# Patient Record
Sex: Male | Born: 1960 | Race: White | Hispanic: No | Marital: Married | Smoking: Never smoker
Health system: Southern US, Community
[De-identification: ages and names within clinical notes are randomized; demographics above are authoritative.]

## PROBLEM LIST (undated history)

## (undated) DIAGNOSIS — T8859XA Other complications of anesthesia, initial encounter: Secondary | ICD-10-CM

## (undated) DIAGNOSIS — T4145XA Adverse effect of unspecified anesthetic, initial encounter: Secondary | ICD-10-CM

---

## 1987-06-14 HISTORY — PX: LACERATION REPAIR: SHX5168

## 2013-06-13 ENCOUNTER — Emergency Department (HOSPITAL_COMMUNITY)
Admission: EM | Admit: 2013-06-13 | Discharge: 2013-06-13 | Disposition: A | Discharge status: Home / Self Care | Payer: PRIVATE HEALTH INSURANCE | Attending: Emergency Medicine | Admitting: Emergency Medicine

## 2013-06-13 ENCOUNTER — Emergency Department (HOSPITAL_COMMUNITY): Payer: PRIVATE HEALTH INSURANCE

## 2013-06-13 DIAGNOSIS — R5381 Other malaise: Secondary | ICD-10-CM | POA: Insufficient documentation

## 2013-06-13 DIAGNOSIS — R5383 Other fatigue: Secondary | ICD-10-CM

## 2013-06-13 DIAGNOSIS — S61209A Unspecified open wound of unspecified finger without damage to nail, initial encounter: Secondary | ICD-10-CM | POA: Insufficient documentation

## 2013-06-13 DIAGNOSIS — S61219A Laceration without foreign body of unspecified finger without damage to nail, initial encounter: Secondary | ICD-10-CM

## 2013-06-13 DIAGNOSIS — Y9389 Activity, other specified: Secondary | ICD-10-CM | POA: Insufficient documentation

## 2013-06-13 DIAGNOSIS — W268XXA Contact with other sharp object(s), not elsewhere classified, initial encounter: Secondary | ICD-10-CM | POA: Insufficient documentation

## 2013-06-13 DIAGNOSIS — Y9289 Other specified places as the place of occurrence of the external cause: Secondary | ICD-10-CM | POA: Insufficient documentation

## 2013-06-13 DIAGNOSIS — M795 Residual foreign body in soft tissue: Secondary | ICD-10-CM

## 2013-06-13 MED ORDER — HYDROCODONE-ACETAMINOPHEN 5-325 MG PO TABS: 1.0000 | ORAL_TABLET | ORAL | Status: AC | PRN

## 2013-06-13 MED ORDER — CEPHALEXIN 500 MG PO CAPS: 500.0000 mg | ORAL_CAPSULE | Freq: Four times a day (QID) | ORAL | Status: AC

## 2013-06-13 NOTE — ED Notes (Signed)
Pt states he cut his right small finger on a beer glass this morning at midnight.  Bleeding is controlled.  He can move digit, but states he has numbness to right medial small finger since laceration.

## 2013-06-13 NOTE — Discharge Instructions (Signed)
Read the information below.  Use the prescribed medication as directed.  Please discuss all new medications with your pharmacist.  Do not take additional tylenol while taking the prescribed pain medication to avoid overdose.  You may return to the Emergency Department at any time for worsening condition or any new symptoms that concern you.  If you develop uncontrolled pain, redness, swelling, pus draining from the wound, or fevers greater than 100.4, return to the ER immediately for a recheck.       Cast or Splint Care Casts and splints support injured limbs and keep bones from moving while they heal.  HOME CARE  Keep the cast or splint uncovered during the drying period.  A plaster cast can take 24 to 48 hours to dry.  A fiberglass cast will dry in less than 1 hour.  Do not rest the cast on anything harder than a pillow for 24 hours.  Do not put weight on your injured limb. Do not put pressure on the cast. Wait for your doctor's approval.  Keep the cast or splint dry.  Cover the cast or splint with a plastic bag during baths or wet weather.  If you have a cast over your chest and belly (trunk), take sponge baths until the cast is taken off.  If your cast gets wet, dry it with a towel or blow dryer. Use the cool setting on the blow dryer.  Keep your cast or splint clean. Wash a dirty cast with a damp cloth.  Do not put any objects under your cast or splint.  Do not scratch the skin under the cast with an object. If itching is a problem, use a blow dryer on a cool setting over the itchy area.  Do not trim or cut your cast.  Do not take out the padding from inside your cast.  Exercise your joints near the cast as told by your doctor.  Raise (elevate) your injured limb on 1 or 2 pillows for the first 1 to 3 days. GET HELP IF:  Your cast or splint cracks.  Your cast or splint is too tight or too loose.  You itch badly under the cast.  Your cast gets wet or has a soft  spot.  You have a bad smell coming from the cast.  You get an object stuck under the cast.  Your skin around the cast becomes red or sore.  You have new or more pain after the cast is put on. GET HELP RIGHT AWAY IF:  You have fluid leaking through the cast.  You cannot move your fingers or toes.  Your fingers or toes turn blue or white or are cool, painful, or puffy (swollen).  You have tingling or lose feeling (numbness) around the injured area.  You have bad pain or pressure under the cast.  You have trouble breathing or have shortness of breath.  You have chest pain. Document Released: 09/29/2010 Document Revised: 01/30/2013 Document Reviewed: 12/06/2012 [facility] Patient Information 2014 [place], [place].  Laceration, Old, Not Sutured A laceration is a cut or lesion that goes through all layers of the skin and into the tissue just beneath the skin. Usually these are stitched up or held together with tape or glue shortly after an injury. However, if several or more hours have passed before getting care, too many germs (bacteria) get into the wound. Stitching it closed at this point brings the risk of infection. If your caregiver feels your laceration is too old,  it is sometimes left open and dressed regularly to allow healing from the bottom layer up. HOME CARE INSTRUCTIONS   You should change the dressing twice a day or as instructed by your caregiver. If the bandage or wound packing sticks, soak it off with soapy water. When you redress your wound, make sure that the dressing or packing goes all the way to the bottom of the wound. The top of the wound is kept open so it can heal from the bottom up. There is less chance for infection with this method.  Twice a day, wash the area with soap and water to remove all the creams or ointments if used. Rinse off the soap. Pat dry with a clean towel. Look for signs of infection (see below).  Re-apply creams or ointments if they were used  to dress the wound. This also helps keep the bandage from sticking.  If the bandage becomes wet, dirty, or develops a foul smell, change it as soon as possible.  Only take over-the-counter or prescription medicines for pain, discomfort, or fever as directed by your caregiver. You might need a tetanus shot now if:  You have no idea when you had the last one.  You have never had a tetanus shot before.  Your cut had dirt in it.  Your lacertaion was dirty, and your last tetanus shot was more than 7 years ago.  Your laceration was clean, and your last tetanus shot was more than 10 years ago. If you need a tetanus shot, and you decide not to get one, there is a rare chance of getting tetanus. Sickness from tetanus can be serious. If you got a tetanus shot, your arm may swell, get red and warm to the touch at the shot site. This is common and not a problem. SEEK MEDICAL CARE IF:   There is redness, swelling, or increasing pain in the wound.  There is a red line that goes up your arm or leg.  Pus is coming from wound.  You develop an unexplained oral temperature above 102 F (38.9 C).  You notice a foul smell coming from the wound or dressing.  You notice something coming out of the wound such as wood or glass.  The wound is on your hand or foot and you find that you are unable to properly move a finger or toe.  There is severe swelling around the wound causing pain and numbness.  There is a change in color in your arm, hand, leg, or foot. MAKE SURE YOU:   Understand these instructions.  Will watch your condition.  Will get help right away if you are not doing well or get worse. Document Released: 04/27/2006 Document Revised: 08/22/2011 Document Reviewed: 11/17/2008 [facility] Patient Information 2014 [place]

## 2013-06-13 NOTE — ED Notes (Signed)
Pt was catching a cup that fell last night at midnight and cut rt pinky finger, has some nubness to digit , pink in color. Bleeding controlled

## 2013-06-13 NOTE — ED Provider Notes (Signed)
CSN: [id number]     Arrival date & time 06/13/13  1137 History   First MD Initiated Contact with Patient 06/13/13 1150 This chart was scribed for non-physician practitioner Trixie Dredge, PA-C working with Donnetta Hutching, MD by Valera Castle, ED scribe. This patient was seen in room WTR5/WTR5 and the patient's care was started at 12:16 PM.     Chief Complaint  Patient presents with  . Laceration    The history is provided by the patient. No language interpreter was used.   HPI Comments: Daniel Buchanan is a 53 y.o. male who presents to the Emergency Department complaining of a deep right pinky laceration, with some associated numbness and sudden, moderate, constant pain, onset last night when he tried to catch a glass cup that broke and cut his finger.  The injury occurred at midnight.  He is unable to bend this finger and is concerned about a tendon injury.  He reports being right hand dominant. He reports washing out the laceration with hydrogen peroxide, and wrapping with steri strips. Bleeding controlled.  He reports he has not seen a hand specialist in the past. He denies any other associated symptoms.   PCP - KAPLAN,KRISTEN, PA-C  No past medical history on file. No past surgical history on file. No family history on file. History  Substance Use Topics  . Smoking status: Not on file  . Smokeless tobacco: Not on file  . Alcohol Use: Not on file    Review of Systems  Skin: Positive for wound (laceration over right pinky finger). Negative for color change and pallor.  Allergic/Immunologic: Negative for immunocompromised state.  Neurological: Positive for weakness and numbness. Negative for syncope, light-headedness and headaches.  Hematological: Does not bruise/bleed easily.    Allergies  Review of patient's allergies indicates no known allergies.  Home Medications   Current Outpatient Rx  Name  Route  Sig  Dispense  Refill  . aspirin 325 MG tablet   Oral   Take 325 mg by mouth  every 4 (four) hours as needed for headache.           Physical Exam  Nursing note and vitals reviewed. Constitutional: He appears well-developed and well-nourished. No distress.  HENT:  Head: Normocephalic and atraumatic.  Neck: Neck supple.  Pulmonary/Chest: Effort normal.  Musculoskeletal:  Right 5th finger laceration over radial aspect, V shaped. Sensation decreased. Cap refill < 2 seconds. Able to move at MCP, but unable to move at DIP and PIP. Wound is hemostatic.  Neurological: He is alert.  Skin: He is not diaphoretic.    ED Course  Procedures (including critical care time)  DIAGNOSTIC STUDIES: Oxygen Saturation is 98% on room air, normal by my interpretation.    COORDINATION OF CARE: 12:19 PM-Discussed treatment plan which includes cleaning the wound and pain medication with pt at bedside and pt agreed to plan. Will give pt splint and advised pt to f/u hand surgeon.  LACERATION REPAIR PROCEDURE NOTE The patient's identification was confirmed and consent was obtained. This procedure was performed by Trixie Dredge, PA-C at 1:55 PM. Site: Right 5th digit Sterile procedures observed: Saline, copious amount of irrigation No FB seen or palpated Anesthetic used (type and amt): 4cc 2% Lidocaine without Epinephrine Suture type/size: 5-0 vicryl Length:2 cm   # of Sutures: 1 Technique: simple interrupted interrupted Complexity: Simple Tetanus UTD per patient  Labs Review Labs Reviewed - No data to display Imaging Review Dg Finger Little Right  06/13/2013   CLINICAL DATA:  Laceration  EXAM: RIGHT LITTLE FINGER 2+V  COMPARISON:  None.  FINDINGS: There is soft tissue injury to the radial side of the 5th digit. There is a linear density along the radial side of the distal interphalangeal joint. This could represent an old avulsion fracture but cannot exclude a foreign body. No evidence of acute fracture. No dislocation.  IMPRESSION: 1. No fracture dislocation. 2. Soft tissue  injury to the radial side of the 5th digit. 3. Potential foreign body at the distal interphalangeal joint versus remote avulsion fracture. Favor remote injury.   Electronically Signed   By: Genevive BiStewart  Edmunds M.D.   On: 06/13/2013 12:05    EKG Interpretation   None       Discussed patient with Dr Merlyn LotKuzma who agrees with plan to clean out and place one suture in ED, d/c home on keflex, he agrees to follow up with him in the office.    MDM   1. Laceration of finger with tendon involvement, initial encounter   2. Foreign body in soft tissue    Patient with laceration to fifth finger of dominant hand. There is tendon involvement. Patient came in 12-13 hours after the incident. I spoke with Dr. Merlyn LotKuzma, hand surgery, who agreed with plan to clean out the wound in place one suture to hold the flap in place, discharge patient home on Keflex with a posterior splint in and he will see him in his office. There was a foreign body noted on the x-ray however I was not able to find it during my exploration. Patient is aware that there is likely a glass foreign body in his finger. Patient discharged home with Norco and Keflex. Hand surgery followup.  Discussed result, findings, treatment, and follow up  with patient.  Pt given return precautions.  Pt verbalizes understanding and agrees with plan.        I personally performed the services described in this documentation, which was scribed in my presence. The recorded information has been reviewed and is accurate.    MarengoEmily Jorian Willhoite, PA-C 06/13/13 [phone]

## 2013-06-14 NOTE — ED Provider Notes (Signed)
Medical screening examination/treatment/procedure(s) were performed by non-physician practitioner and as supervising physician I was immediately available for consultation/collaboration.  EKG Interpretation   None        Donnetta HutchingBrian Clatie Kessen, MD 06/14/13 1406

## 2013-06-18 ENCOUNTER — Other Ambulatory Visit: Payer: Self-pay | Admitting: Orthopedic Surgery

## 2013-06-20 ENCOUNTER — Encounter (HOSPITAL_BASED_OUTPATIENT_CLINIC_OR_DEPARTMENT_OTHER): Payer: Self-pay | Admitting: *Deleted

## 2013-06-20 NOTE — Progress Notes (Signed)
No labs needed

## 2013-06-25 ENCOUNTER — Ambulatory Visit (HOSPITAL_BASED_OUTPATIENT_CLINIC_OR_DEPARTMENT_OTHER): Payer: PRIVATE HEALTH INSURANCE | Admitting: *Deleted

## 2013-06-25 ENCOUNTER — Encounter (HOSPITAL_BASED_OUTPATIENT_CLINIC_OR_DEPARTMENT_OTHER): Payer: PRIVATE HEALTH INSURANCE | Admitting: *Deleted

## 2013-06-25 ENCOUNTER — Encounter (HOSPITAL_BASED_OUTPATIENT_CLINIC_OR_DEPARTMENT_OTHER): Payer: Self-pay | Admitting: *Deleted

## 2013-06-25 ENCOUNTER — Ambulatory Visit (HOSPITAL_BASED_OUTPATIENT_CLINIC_OR_DEPARTMENT_OTHER)
Admission: RE | Admit: 2013-06-25 | Discharge: 2013-06-25 | Disposition: A | Discharge status: Home / Self Care | Payer: PRIVATE HEALTH INSURANCE | Source: Ambulatory Visit | Attending: Orthopedic Surgery | Admitting: Orthopedic Surgery

## 2013-06-25 ENCOUNTER — Encounter (HOSPITAL_BASED_OUTPATIENT_CLINIC_OR_DEPARTMENT_OTHER)
Admission: RE | Disposition: A | Discharge status: Home / Self Care | Payer: Self-pay | Source: Ambulatory Visit | Attending: Orthopedic Surgery

## 2013-06-25 DIAGNOSIS — W268XXA Contact with other sharp object(s), not elsewhere classified, initial encounter: Secondary | ICD-10-CM | POA: Insufficient documentation

## 2013-06-25 DIAGNOSIS — S61209A Unspecified open wound of unspecified finger without damage to nail, initial encounter: Secondary | ICD-10-CM | POA: Insufficient documentation

## 2013-06-25 DIAGNOSIS — T148XXA Other injury of unspecified body region, initial encounter: Secondary | ICD-10-CM | POA: Insufficient documentation

## 2013-06-25 DIAGNOSIS — S65509A Unspecified injury of blood vessel of unspecified finger, initial encounter: Secondary | ICD-10-CM | POA: Insufficient documentation

## 2013-06-25 HISTORY — DX: Adverse effect of unspecified anesthetic, initial encounter: T41.45XA

## 2013-06-25 HISTORY — DX: Other complications of anesthesia, initial encounter: T88.59XA

## 2013-06-25 HISTORY — PX: NERVE AND TENDON REPAIR: SHX5693

## 2013-06-25 LAB — POCT HEMOGLOBIN-HEMACUE: HEMOGLOBIN: 15.1 g/dL (ref 13.0–17.0)

## 2013-06-25 SURGERY — NERVE AND TENDON REPAIR
Anesthesia: General | Site: Hand | Laterality: Right

## 2013-06-25 MED ORDER — CEFAZOLIN SODIUM-DEXTROSE 2-3 GM-% IV SOLR
INTRAVENOUS | Status: AC
  Filled 2013-06-25: qty 50

## 2013-06-25 MED ORDER — CHLORHEXIDINE GLUCONATE 4 % EX LIQD: 60.0000 mL | Freq: Once | CUTANEOUS | Status: DC

## 2013-06-25 MED ORDER — ONDANSETRON HCL 4 MG/2ML IJ SOLN
INTRAMUSCULAR | Status: DC | PRN
  Administered 2013-06-25: 4 mg via INTRAVENOUS

## 2013-06-25 MED ORDER — HYDROCODONE-ACETAMINOPHEN 5-325 MG PO TABS: ORAL_TABLET | ORAL | Status: AC

## 2013-06-25 MED ORDER — MIDAZOLAM HCL 2 MG/2ML IJ SOLN: 1.0000 mg | INTRAMUSCULAR | Status: DC | PRN

## 2013-06-25 MED ORDER — FENTANYL CITRATE 0.05 MG/ML IJ SOLN
INTRAMUSCULAR | Status: DC | PRN
  Administered 2013-06-25: 50 ug via INTRAVENOUS
  Administered 2013-06-25: 100 ug via INTRAVENOUS

## 2013-06-25 MED ORDER — FENTANYL CITRATE 0.05 MG/ML IJ SOLN
INTRAMUSCULAR | Status: AC
  Filled 2013-06-25: qty 6

## 2013-06-25 MED ORDER — PROPOFOL 10 MG/ML IV BOLUS
INTRAVENOUS | Status: DC | PRN
  Administered 2013-06-25: 250 mg via INTRAVENOUS

## 2013-06-25 MED ORDER — FENTANYL CITRATE 0.05 MG/ML IJ SOLN: 50.0000 ug | INTRAMUSCULAR | Status: DC | PRN

## 2013-06-25 MED ORDER — HYDROMORPHONE HCL PF 1 MG/ML IJ SOLN: 0.2500 mg | INTRAMUSCULAR | Status: DC | PRN

## 2013-06-25 MED ORDER — BUPIVACAINE HCL (PF) 0.25 % IJ SOLN
INTRAMUSCULAR | Status: DC | PRN
  Administered 2013-06-25: 10 mL

## 2013-06-25 MED ORDER — EPHEDRINE SULFATE 50 MG/ML IJ SOLN
INTRAMUSCULAR | Status: DC | PRN
  Administered 2013-06-25: 5 mg via INTRAVENOUS
  Administered 2013-06-25: 10 mg via INTRAVENOUS

## 2013-06-25 MED ORDER — LIDOCAINE HCL (CARDIAC) 20 MG/ML IV SOLN
INTRAVENOUS | Status: DC | PRN
  Administered 2013-06-25: 75 mg via INTRAVENOUS

## 2013-06-25 MED ORDER — OXYCODONE HCL 5 MG PO TABS: 5.0000 mg | ORAL_TABLET | Freq: Once | ORAL | Status: DC | PRN

## 2013-06-25 MED ORDER — MIDAZOLAM HCL 5 MG/5ML IJ SOLN
INTRAMUSCULAR | Status: DC | PRN
  Administered 2013-06-25: 2 mg via INTRAVENOUS

## 2013-06-25 MED ORDER — CEFAZOLIN SODIUM-DEXTROSE 2-3 GM-% IV SOLR
2.0000 g | INTRAVENOUS | Status: AC
  Administered 2013-06-25: 2 g via INTRAVENOUS

## 2013-06-25 MED ORDER — DEXAMETHASONE SODIUM PHOSPHATE 10 MG/ML IJ SOLN
INTRAMUSCULAR | Status: DC | PRN
  Administered 2013-06-25: 8 mg via INTRAVENOUS

## 2013-06-25 MED ORDER — BUPIVACAINE HCL (PF) 0.25 % IJ SOLN
INTRAMUSCULAR | Status: AC
  Filled 2013-06-25: qty 30

## 2013-06-25 MED ORDER — LACTATED RINGERS IV SOLN
INTRAVENOUS | Status: DC
  Administered 2013-06-25 (×2): via INTRAVENOUS

## 2013-06-25 MED ORDER — MIDAZOLAM HCL 2 MG/2ML IJ SOLN
INTRAMUSCULAR | Status: AC
  Filled 2013-06-25: qty 2

## 2013-06-25 MED ORDER — OXYCODONE HCL 5 MG/5ML PO SOLN: 5.0000 mg | Freq: Once | ORAL | Status: DC | PRN

## 2013-06-25 SURGICAL SUPPLY — 59 items
BAG DECANTER FOR FLEXI CONT (MISCELLANEOUS) IMPLANT
BANDAGE ELASTIC 3 VELCRO ST LF (GAUZE/BANDAGES/DRESSINGS) ×2 IMPLANT
BLADE MINI RND TIP GREEN BEAV (BLADE) IMPLANT
BLADE SURG 15 STRL LF DISP TIS (BLADE) ×2 IMPLANT
BLADE SURG 15 STRL SS (BLADE) ×2
BNDG ESMARK 4X9 LF (GAUZE/BANDAGES/DRESSINGS) ×2 IMPLANT
BNDG GAUZE ELAST 4 BULKY (GAUZE/BANDAGES/DRESSINGS) ×2 IMPLANT
CHLORAPREP W/TINT 26ML (MISCELLANEOUS) ×2 IMPLANT
CORDS BIPOLAR (ELECTRODE) ×2 IMPLANT
COVER MAYO STAND STRL (DRAPES) ×2 IMPLANT
COVER TABLE BACK 60X90 (DRAPES) ×2 IMPLANT
CUFF TOURNIQUET SINGLE 18IN (TOURNIQUET CUFF) ×2 IMPLANT
DECANTER SPIKE VIAL GLASS SM (MISCELLANEOUS) IMPLANT
DRAPE EXTREMITY T 121X128X90 (DRAPE) ×2 IMPLANT
DRAPE SURG 17X23 STRL (DRAPES) ×2 IMPLANT
GAUZE XEROFORM 1X8 LF (GAUZE/BANDAGES/DRESSINGS) ×2 IMPLANT
GLOVE BIO SURGEON STRL SZ 6.5 (GLOVE) ×2 IMPLANT
GLOVE BIO SURGEON STRL SZ7.5 (GLOVE) ×2 IMPLANT
GLOVE BIOGEL PI IND STRL 7.0 (GLOVE) ×1 IMPLANT
GLOVE BIOGEL PI IND STRL 8 (GLOVE) ×2 IMPLANT
GLOVE BIOGEL PI IND STRL 8.5 (GLOVE) ×1 IMPLANT
GLOVE BIOGEL PI INDICATOR 7.0 (GLOVE) ×1
GLOVE BIOGEL PI INDICATOR 8 (GLOVE) ×2
GLOVE BIOGEL PI INDICATOR 8.5 (GLOVE) ×1
GLOVE SURG ORTHO 8.0 STRL STRW (GLOVE) ×2 IMPLANT
GOWN STRL REUS W/ TWL LRG LVL3 (GOWN DISPOSABLE) ×1 IMPLANT
GOWN STRL REUS W/TWL LRG LVL3 (GOWN DISPOSABLE) ×1
GOWN STRL REUS W/TWL XL LVL3 (GOWN DISPOSABLE) IMPLANT
LOOP VESSEL MAXI BLUE (MISCELLANEOUS) IMPLANT
NDL SAFETY ECLIPSE 18X1.5 (NEEDLE) IMPLANT
NEEDLE HYPO 18GX1.5 SHARP (NEEDLE)
NEEDLE HYPO 25X1 1.5 SAFETY (NEEDLE) ×2 IMPLANT
NS IRRIG 1000ML POUR BTL (IV SOLUTION) ×2 IMPLANT
PACK BASIN DAY SURGERY FS (CUSTOM PROCEDURE TRAY) ×2 IMPLANT
PAD CAST 3X4 CTTN HI CHSV (CAST SUPPLIES) ×1 IMPLANT
PAD CAST 4YDX4 CTTN HI CHSV (CAST SUPPLIES) IMPLANT
PADDING CAST ABS 4INX4YD NS (CAST SUPPLIES) ×1
PADDING CAST ABS COTTON 4X4 ST (CAST SUPPLIES) ×1 IMPLANT
PADDING CAST COTTON 3X4 STRL (CAST SUPPLIES) ×1
PADDING CAST COTTON 4X4 STRL (CAST SUPPLIES)
SLEEVE SCD COMPRESS KNEE MED (MISCELLANEOUS) ×2 IMPLANT
SPEAR EYE SURG WECK-CEL (MISCELLANEOUS) ×2 IMPLANT
SPLINT PLASTER CAST XFAST 3X15 (CAST SUPPLIES) ×10 IMPLANT
SPLINT PLASTER XTRA FASTSET 3X (CAST SUPPLIES) ×10
SPONGE GAUZE 4X4 12PLY (GAUZE/BANDAGES/DRESSINGS) ×2 IMPLANT
STOCKINETTE 4X48 STRL (DRAPES) ×2 IMPLANT
SUT ETHIBOND 3-0 V-5 (SUTURE) IMPLANT
SUT ETHILON 4 0 PS 2 18 (SUTURE) ×2 IMPLANT
SUT FIBERWIRE 4-0 18 TAPR NDL (SUTURE) ×2
SUT MERSILENE 6 0 P 1 (SUTURE) ×2 IMPLANT
SUT NYLON 9 0 VRM6 (SUTURE) IMPLANT
SUT PROLENE 6 0 P 1 18 (SUTURE) IMPLANT
SUT SILK 4 0 PS 2 (SUTURE) ×2 IMPLANT
SUT VICRYL 4-0 PS2 18IN ABS (SUTURE) IMPLANT
SUTURE FIBERWR 4-0 18 TAPR NDL (SUTURE) ×1 IMPLANT
SYR BULB 3OZ (MISCELLANEOUS) ×2 IMPLANT
SYR CONTROL 10ML LL (SYRINGE) ×2 IMPLANT
TOWEL OR 17X24 6PK STRL BLUE (TOWEL DISPOSABLE) ×4 IMPLANT
UNDERPAD 30X30 INCONTINENT (UNDERPADS AND DIAPERS) ×2 IMPLANT

## 2013-06-25 NOTE — Anesthesia Postprocedure Evaluation (Signed)
  Anesthesia Post-op Note  Patient: Daniel PolioJaime Buchanan  Procedure(s) Performed: Procedure(s): RIGHT SMALL FLEXOR TENDON REPAIR AND DIGITAL NERVE REPAIR (Right)  Patient Location: PACU  Anesthesia Type:General  Level of Consciousness: awake and alert   Airway and Oxygen Therapy: Patient Spontanous Breathing  Post-op Pain: none  Post-op Assessment: Post-op Vital signs reviewed, Patient's Cardiovascular Status Stable and Respiratory Function Stable  Post-op Vital Signs: Reviewed  Filed Vitals:   06/25/13 1144  BP: 128/74  Pulse: 60  Temp: 36.6 C  Resp: 16    Complications: No apparent anesthesia complications

## 2013-06-25 NOTE — Discharge Instructions (Addendum)

## 2013-06-25 NOTE — Anesthesia Preprocedure Evaluation (Addendum)
Anesthesia Evaluation  Patient identified by MRN, date of birth, ID band Patient awake    Reviewed: Allergy & Precautions, H&P , NPO status , Patient's Chart, lab work & pertinent test results  Airway Mallampati: II TM Distance: >3 FB Neck ROM: Full    Dental no notable dental hx. (+) Teeth Intact and Dental Advisory Given   Pulmonary neg pulmonary ROS,  breath sounds clear to auscultation  Pulmonary exam normal       Cardiovascular negative cardio ROS  Rhythm:Regular Rate:Normal     Neuro/Psych negative neurological ROS  negative psych ROS   GI/Hepatic negative GI ROS, Neg liver ROS,   Endo/Other  negative endocrine ROS  Renal/GU negative Renal ROS  negative genitourinary   Musculoskeletal   Abdominal   Peds  Hematology negative hematology ROS (+)   Anesthesia Other Findings   Reproductive/Obstetrics negative OB ROS                          Anesthesia Physical Anesthesia Plan  ASA: I  Anesthesia Plan: General   Post-op Pain Management:    Induction: Intravenous  Airway Management Planned: LMA  Additional Equipment:   Intra-op Plan:   Post-operative Plan: Extubation in OR  Informed Consent: I have reviewed the patients History and Physical, chart, labs and discussed the procedure including the risks, benefits and alternatives for the proposed anesthesia with the patient or authorized representative who has indicated his/her understanding and acceptance.   Dental advisory given  Plan Discussed with: CRNA  Anesthesia Plan Comments:         Anesthesia Quick Evaluation

## 2013-06-25 NOTE — Transfer of Care (Signed)
Immediate Anesthesia Transfer of Care Note  Patient: Daniel Buchanan  Procedure(s) Performed: Procedure(s): RIGHT SMALL FLEXOR TENDON REPAIR AND DIGITAL NERVE REPAIR (Right)  Patient Location: PACU  Anesthesia Type:General  Level of Consciousness: awake, sedated and patient cooperative  Airway & Oxygen Therapy: Patient Spontanous Breathing and Patient connected to face mask oxygen  Post-op Assessment: Report given to PACU RN, Post -op Vital signs reviewed and stable and Patient moving all extremities  Post vital signs: Reviewed and stable  Complications: No apparent anesthesia complications

## 2013-06-25 NOTE — Anesthesia Procedure Notes (Signed)
Procedure Name: LMA Insertion Date/Time: 06/25/2013 8:39 AM Performed by: Tami RibasKUZMA, KEVIN R Pre-anesthesia Checklist: Patient identified, Emergency Drugs available, Suction available and Patient being monitored Patient Re-evaluated:Patient Re-evaluated prior to inductionOxygen Delivery Method: Circle System Utilized Preoxygenation: Pre-oxygenation with 100% oxygen Intubation Type: IV induction Ventilation: Mask ventilation without difficulty LMA: LMA inserted LMA Size: 5.0 Number of attempts: 1 Airway Equipment and Method: bite block Placement Confirmation: positive ETCO2 and breath sounds checked- equal and bilateral Tube secured with: Tape Dental Injury: Teeth and Oropharynx as per pre-operative assessment

## 2013-06-25 NOTE — H&P (Signed)
  Daniel Buchanan is an 53 y.o. male.   Chief Complaint: right small finger laceration HPI: 53 yo rhd male states he lacerated right small finger while trying to catch a falling glass 06/13/13.  Seen in ED and wound I&D'd and single suture placed.  Followed up in office.  Unable to flex finger.  Reports no previous injury to right small.  Past Medical History  Diagnosis Date  . Complication of anesthesia     very sore throat    Past Surgical History  Procedure Laterality Date  . Laceration repair  1989    left thumb    History reviewed. No pertinent family history. Social History:  reports that he has never smoked. He does not have any smokeless tobacco history on file. He reports that he drinks alcohol. He reports that he does not use illicit drugs.  Allergies: No Known Allergies  Medications Prior to Admission  Medication Sig Dispense Refill  . aspirin 325 MG tablet Take 325 mg by mouth every 4 (four) hours as needed for headache.      . cephALEXin (KEFLEX) 500 MG capsule Take 1 capsule (500 mg total) by mouth 4 (four) times daily.  28 capsule  0  . HYDROcodone-acetaminophen (NORCO/VICODIN) 5-325 MG per tablet Take 1 tablet by mouth every 4 (four) hours as needed.  15 tablet  0    Results for orders placed during the hospital encounter of 06/25/13 (from the past 48 hour(s))  POCT HEMOGLOBIN-HEMACUE     Status: None   Collection Time    06/25/13  7:36 AM      Result Value Range   Hemoglobin 15.1  13.0 - 17.0 g/dL    No results found.   A comprehensive review of systems was negative.  Blood pressure 147/93, pulse 76, temperature 98.1 F (36.7 C), temperature source Oral, resp. rate 18, height 5\' 9"  (1.753 m), weight 188 lb (85.276 kg), SpO2 97.00%.  General appearance: alert, cooperative and appears stated age Head: Normocephalic, without obvious abnormality, atraumatic Neck: supple, symmetrical, trachea midline Resp: clear to auscultation bilaterally Cardio: regular rate  and rhythm GI: non tender Extremities: intact sensation and capillary refill all digits except right small where he has decreased sensation.  unable to flex right small finger.  wound at pip level of small finger. Pulses: 2+ and symmetric Skin: Skin color, texture, turgor normal. No rashes or lesions Neurologic: Grossly normal Incision/Wound: As above  Assessment/Plan Right small finger laceration with tendon and nerve injury.  Non operative and operative treatment options were discussed with the patient and patient wishes to proceed with operative treatment. Risks, benefits, and alternatives of surgery were discussed and the patient agrees with the plan of care.   Marleta Lapierre R 06/25/2013, 8:23 AM

## 2013-06-25 NOTE — Op Note (Signed)
[id number] 

## 2013-06-25 NOTE — Transfer of Care (Deleted)
Immediate Anesthesia Transfer of Care Note  Patient: Daniel Buchanan  Procedure(s) Performed: Procedure(s): RIGHT SMALL FLEXOR TENDON REPAIR AND DIGITAL NERVE REPAIR (Right)  Patient Location: PACU  Anesthesia Type:General  Level of Consciousness: awake, sedated and patient cooperative  Airway & Oxygen Therapy: Patient Spontanous Breathing and Patient connected to face mask oxygen  Post-op Assessment: Report given to PACU RN, Post -op Vital signs reviewed and stable and Patient moving all extremities  Post vital signs: Reviewed and stable  Complications: No apparent anesthesia complications 

## 2013-06-25 NOTE — Brief Op Note (Signed)
06/25/2013  10:09 AM  PATIENT:  Daniel Buchanan  53 y.o. male  PRE-OPERATIVE DIAGNOSIS:  RIGHT SMALL FLEXOR TENDON/ DIGITAL NERVE LACERATION   POST-OPERATIVE DIAGNOSIS:  RIGHT SMALL FLEXOR TENDON/ DIGITAL NERVE LACERATION   PROCEDURE:  Procedure(s): RIGHT SMALL FLEXOR TENDON REPAIR AND DIGITAL NERVE REPAIR (Right) and digital artery repair  SURGEON:  Surgeon(s) and Role:    * Tami RibasKevin R Zoejane Gaulin, MD - Primary    * Nicki ReaperGary R Cinzia Devos, MD - Assisting  PHYSICIAN ASSISTANT:   ASSISTANTS: Cindee SaltGary Ascher Schroepfer, MD   ANESTHESIA:   general  EBL:  Total I/O In: 1600 [I.V.:1600] Out: -   BLOOD ADMINISTERED:none  DRAINS: none   LOCAL MEDICATIONS USED:  MARCAINE     SPECIMEN:  No Specimen  DISPOSITION OF SPECIMEN:  N/A  COUNTS:  YES  TOURNIQUET:   Total Tourniquet Time Documented: Upper Arm (Right) - 67 minutes Total: Upper Arm (Right) - 67 minutes   DICTATION: .Other Dictation: Dictation Number [id number]  PLAN OF CARE: Discharge to home after PACU  PATIENT DISPOSITION:  PACU - hemodynamically stable.

## 2013-06-26 ENCOUNTER — Encounter (HOSPITAL_BASED_OUTPATIENT_CLINIC_OR_DEPARTMENT_OTHER): Payer: Self-pay | Admitting: Orthopedic Surgery

## 2013-06-26 NOTE — Op Note (Deleted)
Daniel, Buchanan NO.:  [id number]  MEDICAL RECORD NO.:  [id number]  LOCATION:                                 FACILITY:  PHYSICIAN:  Daniel Loa, MD        DATE OF BIRTH:  [birth year]  DATE OF PROCEDURE:  06/25/2013 DATE OF DISCHARGE:  06/25/2013                              OPERATIVE REPORT   PREOPERATIVE DIAGNOSIS:  Right small finger flexor tendon lacerations and digital nerve lacerations.  POSTOPERATIVE DIAGNOSIS:  Right small finger FDP and FDS lacerations and radial digital nerve and artery lacerations.  PROCEDURES:  Right small finger repair of FDP and FDS in zone 2.  Repair of radial digital nerve and artery under the microscope.  SURGEON:  Daniel Loa, MD.  ASSISTANT:  Daniel Salt, MD.  ANESTHESIA:  General.  IV FLUIDS:  Per Anesthesia flow sheet.  ESTIMATED BLOOD LOSS:  Minimal.  COMPLICATIONS:  None.  SPECIMENS:  None.  TOURNIQUET TIME:  67 minutes.  DISPOSITION:  Stable to PACU.  INDICATIONS:  Daniel Buchanan is a 53 year old male with lacerations in the right small finger, June 13, 2013.  Presented to the emergency department 12 hours later where the wound was irrigated and a single suture placed.  He was unable to flex the finger and has decreased sensation on the radial side.  We discussed operative repair.  Risks, benefits, alternatives of surgery were discussed including risk of blood loss, infection, damage to nerves, vessels, tendons, ligaments, bone, failure of surgery, need for additional surgery, complications with wound healing, continued pain, and stiffness.  He voiced understanding of these risks and elected to proceed.  OPERATIVE COURSE:  After being identified preoperatively by myself, the patient and I agreed upon procedure and site of procedure.  Surgical site was marked.  Risks, benefits, and alternatives of surgery were reviewed and he wished to proceed.  Surgical consent was signed.  He was given IV Ancef as  preoperative antibiotic prophylaxis.  He was transported to the operating room, placed on the operating room table in supine position with left upper extremity on arm board.  General anesthesia was induced by anesthesiologist.  Right upper extremity was prepped and draped in normal sterile orthopedic fashion.  A surgical pause was performed between surgeons, Anesthesia, and operating room staff and all were in agreement as to the patient, procedure, and site of procedure.  Tourniquet at the proximal aspect of the extremities inflated to 250 mmHg after exsanguination of the limb with Esmarch bandage.  The sutures in the laceration were then removed.  The laceration was extended proximally and distally.  This was carried into subcutaneous tissues by spreading technique.  Lacerations to the flexor tendons were visualized.  It was just under the A4 pulley.  Laceration to the FDS was on the deep surface after the __________.  The radial digital nerve and artery were identified and had been lacerated as well. The ulnar digital nerve and artery were intact.  The tendons were dressed first.  As the FDS was very flat, 4-0 FiberWire suture was used in a figure-of-eight fashion to reapproximate the ends.  The FDP tendon was repaired with a 2-strand modified Haynes Bast  core suture augmented with a 6-0 Prolene epitendinous suture.  The core suture was the 4-0 FiberWire.  This provided good apposition of the tendon ends.  The A2 pulley had been lacerated by the injury.  The microscope was brought in. The artery ends were freshened sharply with the scissors.  The 9-0 nylon suture was used to repair the artery in an interrupted circumferential fashion.  This provided good apposition of the arterial ends.  The digital nerve was repaired again with a nylon suture in an interrupted circumferential fashion.  The wound was copiously irrigated with sterile saline.  It was closed with 4-0 nylon in a horizontal  mattress fashion. It was injected with 4 mL of 0.25% plain Marcaine to aid in postoperative analgesia.  It was then dressed with sterile Xeroform, 4x4s, and wrapped with a Kerlix bandage.  A dorsal blocking splint at the long, ring, and small fingers was placed and wrapped with Kerlix and Ace bandage.  The tourniquet was deflated at 67 minutes.  Fingertips were pink with brisk capillary refill after deflation of tourniquet. The operative drapes were broken down.  The patient was woken from anesthesia safely.  He was transferred back to stretcher and taken to PACU in stable condition.  I will see him back in the office in 1 week for postoperative followup.  We will give him Norco 5/325, 1-2 p.o. q.6 hours p.r.n. pain, dispensed #40.     Daniel LoaKevin Yamir Carignan, MD     KK/MEDQ  D:  06/25/2013  T:  06/25/2013  Job:  [id number]

## 2013-06-26 NOTE — Op Note (Signed)
NAMMercer Pod:  Ballantine, Generoso              ACCOUNT NO.:  [id number]  MEDICAL RECORD NO.:  [id number]  LOCATION:                                 FACILITY:  PHYSICIAN:  Betha LoaKevin Kyriana Yankee, MD        DATE OF BIRTH:  1960-11-23  DATE OF PROCEDURE:  06/25/2013 DATE OF DISCHARGE:  06/25/2013                              OPERATIVE REPORT   PREOPERATIVE DIAGNOSIS:  Right small finger flexor tendon lacerations and digital nerve lacerations.  POSTOPERATIVE DIAGNOSIS:  Right small finger FDP and FDS lacerations and radial digital nerve and artery lacerations.  PROCEDURES:   1. Right small finger repair of FDP in zone 2 2. Right small finger repair of FDS in zone 2   3. Right small finger repair of radial digital nerve under microscope 4. Right small finger repair radial digital artery under microscope  SURGEON:  Betha LoaKevin Dayshon Roback, MD.  ASSISTANT:  Cindee SaltGary Crissa Sowder, MD.  ANESTHESIA:  General.  IV FLUIDS:  Per Anesthesia flow sheet.  ESTIMATED BLOOD LOSS:  Minimal.  COMPLICATIONS:  None.  SPECIMENS:  None.  TOURNIQUET TIME:  67 minutes.  DISPOSITION:  Stable to PACU.  INDICATIONS:  Mr. Daniel Buchanan is a 53 year old male with lacerations in the right small finger, June 13, 2013.  Presented to the emergency department 12 hours later where the wound was irrigated and a single suture placed.  He was unable to flex the finger and has decreased sensation on the radial side.  We discussed operative repair.  Risks, benefits, alternatives of surgery were discussed including risk of blood loss, infection, damage to nerves, vessels, tendons, ligaments, bone, failure of surgery, need for additional surgery, complications with wound healing, continued pain, and stiffness.  He voiced understanding of these risks and elected to proceed.  OPERATIVE COURSE:  After being identified preoperatively by myself, the patient and I agreed upon procedure and site of procedure.  Surgical site was marked.  Risks, benefits, and  alternatives of surgery were reviewed and he wished to proceed.  Surgical consent was signed.  He was given IV Ancef as preoperative antibiotic prophylaxis.  He was transported to the operating room, placed on the operating room table in supine position with left upper extremity on arm board.  General anesthesia was induced by anesthesiologist.  Right upper extremity was prepped and draped in normal sterile orthopedic fashion.  A surgical pause was performed between surgeons, Anesthesia, and operating room staff and all were in agreement as to the patient, procedure, and site of procedure.  Tourniquet at the proximal aspect of the extremity was inflated to 250 mmHg after exsanguination of the limb with Esmarch bandage.  The sutures in the laceration were then removed.  The laceration was extended proximally and distally.  This was carried into subcutaneous tissues by spreading technique.  Lacerations to the flexor tendons were visualized.  It was just under the A4 pulley.  Laceration to the FDS was on the deep surface after the chiasm.  The radial digital nerve and artery were identified and had been lacerated as well. The ulnar digital nerve and artery were intact.  The tendons were dressed first.  As the FDS was very flat, 4-0  FiberWire suture was used in a figure-of-eight fashion to reapproximate the ends.  The FDP tendon was repaired with a 2-strand modified Kessler core suture augmented with a 6-0 Prolene epitendinous suture.  The core suture was the 4-0 FiberWire.  This provided good apposition of the tendon ends.  The A2 pulley had been lacerated by the injury.  The microscope was brought in. The artery ends were freshened sharply with the scissors.  The 9-0 nylon suture was used to repair the artery in an interrupted circumferential fashion.  This provided good apposition of the arterial ends.  The digital nerve was repaired again with a nylon suture in an  interrupted circumferential fashion.  The wound was copiously irrigated with sterile saline.  It was closed with 4-0 nylon in a horizontal mattress fashion. It was injected with 4 mL of 0.25% plain Marcaine to aid in postoperative analgesia.  It was then dressed with sterile Xeroform, 4x4s, and wrapped with a Kerlix bandage.  A dorsal blocking splint at the long, ring, and small fingers was placed and wrapped with Kerlix and Ace bandage.  The tourniquet was deflated at 67 minutes.  Fingertips were pink with brisk capillary refill after deflation of tourniquet. The operative drapes were broken down.  The patient was woken from anesthesia safely.  He was transferred back to stretcher and taken to PACU in stable condition.  I will see him back in the office in 1 week for postoperative followup.  We will give him Norco 5/325, 1-2 p.o. q.6 hours p.r.n. pain, dispensed #40.     Betha Loa, MD     KK/MEDQ  D:  06/25/2013  T:  06/25/2013  Job:  [id number]

## 2014-10-13 IMAGING — CR DG FINGER LITTLE 2+V*R*
3 series · 3 of 3 positions shown · non-contrast
Comparison: None.

CLINICAL DATA: Laceration

EXAM:
RIGHT LITTLE FINGER 2+V

[x finger pa right]
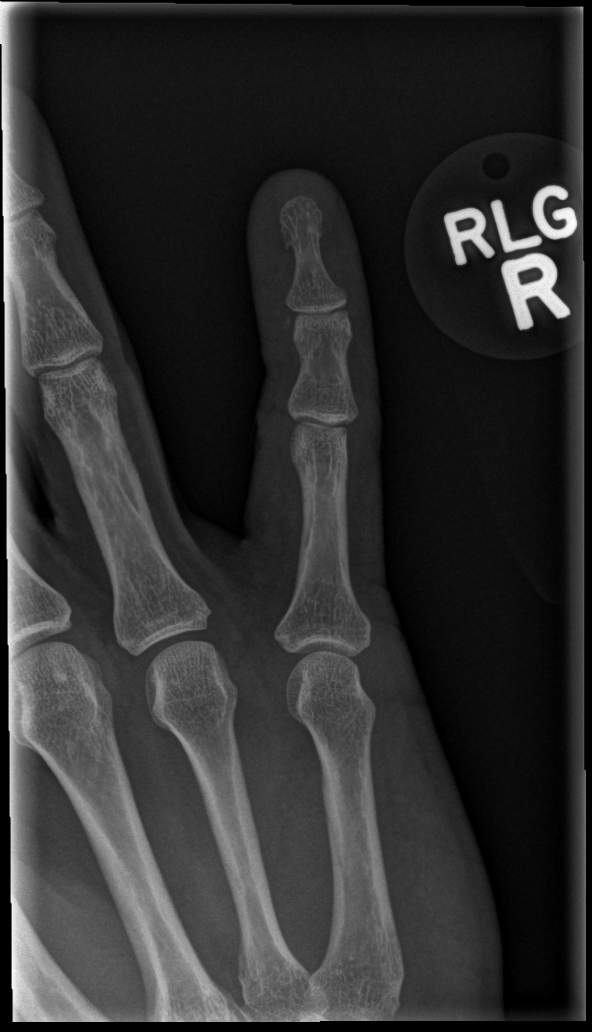

[x finger obl right]
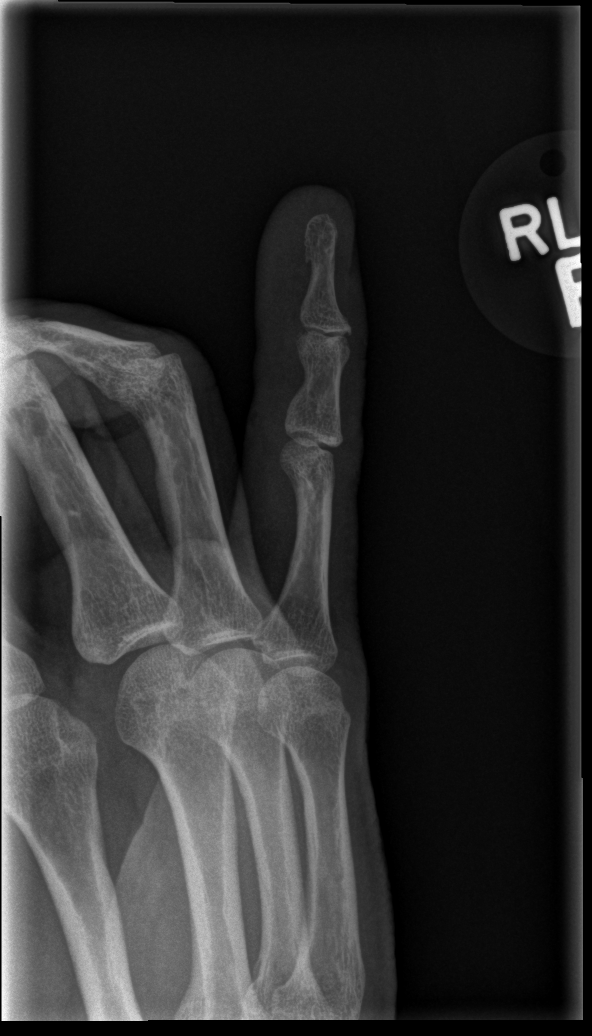

[x finger lat right]
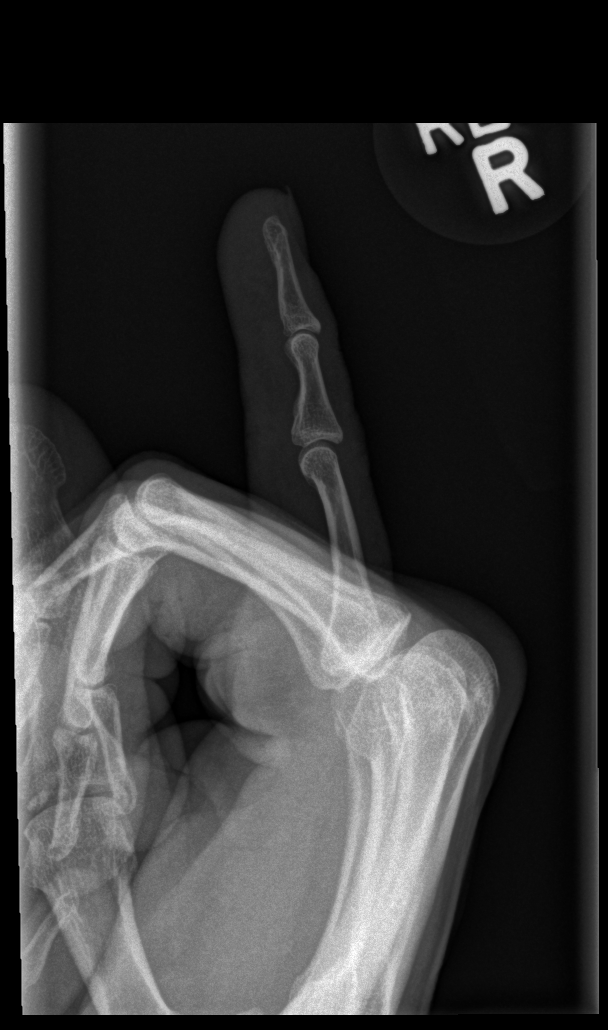

[3 of 3 positions shown; findings below may reference images not displayed]

FINDINGS: There is soft tissue injury to the radial side of the 5th digit.
There is a linear density along the radial side of the distal
interphalangeal joint. This could represent an old avulsion fracture
but cannot exclude a foreign body. No evidence of acute fracture. No
dislocation.
IMPRESSION: 1. No fracture dislocation.
2. Soft tissue injury to the radial side of the 5th digit.
3. Potential foreign body at the distal interphalangeal joint versus
remote avulsion fracture. Favor remote injury.

## 2017-12-19 ENCOUNTER — Other Ambulatory Visit: Payer: Self-pay

## 2017-12-19 ENCOUNTER — Emergency Department (HOSPITAL_COMMUNITY)
Admission: EM | Admit: 2017-12-19 | Discharge: 2017-12-19 | Disposition: A | Discharge status: Home / Self Care | Payer: BLUE CROSS/BLUE SHIELD | Attending: Emergency Medicine | Admitting: Emergency Medicine

## 2017-12-19 ENCOUNTER — Encounter (HOSPITAL_COMMUNITY): Payer: Self-pay | Admitting: Emergency Medicine

## 2017-12-19 ENCOUNTER — Emergency Department (HOSPITAL_COMMUNITY): Payer: BLUE CROSS/BLUE SHIELD

## 2017-12-19 DIAGNOSIS — Z79899 Other long term (current) drug therapy: Secondary | ICD-10-CM | POA: Insufficient documentation

## 2017-12-19 DIAGNOSIS — Z7982 Long term (current) use of aspirin: Secondary | ICD-10-CM | POA: Insufficient documentation

## 2017-12-19 DIAGNOSIS — R079 Chest pain, unspecified: Secondary | ICD-10-CM

## 2017-12-19 LAB — CBC
HEMATOCRIT: 47.8 % (ref 39.0–52.0)
Hemoglobin: 15.5 g/dL (ref 13.0–17.0)
MCH: 31 pg (ref 26.0–34.0)
MCHC: 32.4 g/dL (ref 30.0–36.0)
MCV: 95.6 fL (ref 78.0–100.0)
PLATELETS: 224 10*3/uL (ref 150–400)
RBC: 5 MIL/uL (ref 4.22–5.81)
RDW: 11.9 % (ref 11.5–15.5)
WBC: 4.4 10*3/uL (ref 4.0–10.5)

## 2017-12-19 LAB — BASIC METABOLIC PANEL
Anion gap: 10 (ref 5–15)
BUN: 18 mg/dL (ref 6–20)
CHLORIDE: 105 mmol/L (ref 98–111)
CO2: 25 mmol/L (ref 22–32)
Calcium: 9.3 mg/dL (ref 8.9–10.3)
Creatinine, Ser: 0.92 mg/dL (ref 0.61–1.24)
GFR calc Af Amer: >60 mL/min (ref >60)
GFR calc non Af Amer: >60 mL/min (ref >60)
GLUCOSE: 103 mg/dL — AB (ref 70–99)
POTASSIUM: 3.9 mmol/L (ref 3.5–5.1)
Sodium: 140 mmol/L (ref 135–145)

## 2017-12-19 LAB — I-STAT TROPONIN, ED
Troponin i, poc: 0 ng/mL (ref 0.00–0.08)
Troponin i, poc: 0 ng/mL (ref 0.00–0.08)

## 2017-12-19 MED ORDER — ASPIRIN 81 MG PO CHEW
324.0000 mg | CHEWABLE_TABLET | Freq: Once | ORAL | Status: AC
  Administered 2017-12-19: 324 mg via ORAL
  Filled 2017-12-19: qty 4

## 2017-12-19 NOTE — ED Notes (Addendum)
ED Provider at bedside. 

## 2017-12-19 NOTE — ED Notes (Signed)
Patient transported to X-ray 

## 2017-12-19 NOTE — ED Provider Notes (Signed)
[facility] EMERGENCY DEPARTMENT Provider Note   CSN: [id number] Arrival date & time: 12/19/17  6578     History   Chief Complaint Chief Complaint  Patient presents with  . Tingling  . Chest Pain    HPI Daniel Buchanan is a 57 y.o. male.  HPI  57 year old male presents with chest discomfort.  He states he awoke at 4 AM with dull pain to the left side of his chest.  He also noticed some discomfort going down his arm and tingling in his fingertips.  No weakness in his arm.  No right-sided symptoms.  No diaphoresis, nausea, vomiting, or shortness of breath.  He felt a little warm like a hot flash but did not sweat.  The pain was mild in intensity.  It lasted till about 6 AM.  The tingling is also gone.  Currently he feels fine.  He states he has no medical problems but does take a baby aspirin every day because of his age and he wants to prevent any medical problems.  No recent leg swelling or pain. No known PMH. Typically his BP is ok when going to PCP.  Past Medical History:  Diagnosis Date  . Complication of anesthesia    very sore throat    There are no active problems to display for this patient.   Past Surgical History:  Procedure Laterality Date  . LACERATION REPAIR  1989   left thumb  . NERVE AND TENDON REPAIR Right 06/25/2013   Procedure: RIGHT SMALL FLEXOR TENDON REPAIR AND DIGITAL NERVE REPAIR;  Surgeon: Tami Ribas, MD;  Location: [facility];  Service: Orthopedics;  Laterality: Right;        Home Medications    Prior to Admission medications   Medication Sig Start Date End Date Taking? Authorizing Provider  aspirin 81 MG tablet Take 81 mg by mouth 2 (two) times daily.    Yes [provider]  Chlorpheniramine-Phenylephrine (ACTIFED COLD/ALLERGY PO) Take 1 tablet by mouth daily as needed.   Yes [provider]  cholecalciferol (VITAMIN D) 1000 units tablet Take 1,000 Units by mouth daily.   Yes [provider]  Ginkgo Biloba (GINKOBA) 40 MG TABS Take 1 tablet by mouth daily.   Yes [provider]  Multiple Vitamins-Minerals (MULTIVITAMIN WITH MINERALS) tablet Take 1 tablet by mouth daily.   Yes [provider]  Potassium 99 MG TABS Take 1 tablet by mouth daily.   Yes [provider]  sildenafil (VIAGRA) 100 MG tablet Take 100 mg by mouth daily as needed. 10/02/17  Yes [provider]  Turmeric 500 MG TABS Take 1 tablet by mouth daily.   Yes [provider]  vitamin B-12 (CYANOCOBALAMIN) 500 MCG tablet Take 500 mcg by mouth daily.   Yes [provider]  vitamin C (ASCORBIC ACID) 500 MG tablet Take 500 mg by mouth daily.   Yes [provider]  vitamin E 100 UNIT capsule Take 100 Units by mouth daily.   Yes [provider]  cephALEXin (KEFLEX) 500 MG capsule Take 1 capsule (500 mg total) by mouth 4 (four) times daily. Patient not taking: Reported on 12/19/2017 06/13/13   Trixie Dredge, PA-C  HYDROcodone-acetaminophen [facility]) 5-325 MG per tablet 1-2 tabs po q6 hours prn pain Patient not taking: Reported on 12/19/2017 06/25/13   Betha Loa, MD  HYDROcodone-acetaminophen (NORCO/VICODIN) 5-325 MG per tablet Take 1 tablet by mouth every 4 (four) hours as needed. Patient not taking:  Reported on 12/19/2017 06/13/13   Trixie Dredge, PA-C    Family History No family history on file.  Social History Social History   Tobacco Use  . Smoking status: Never Smoker  . Smokeless tobacco: Never Used  Substance Use Topics  . Alcohol use: Yes    Comment: occ  . Drug use: No     Allergies   Pollen extract   Review of Systems Review of Systems  Constitutional: Negative for diaphoresis.  Respiratory: Negative for shortness of breath.   Cardiovascular: Positive for chest pain. Negative for leg swelling.  Gastrointestinal: Negative for abdominal pain, nausea and vomiting.  Neurological: Positive for numbness. Negative for weakness.    All other systems reviewed and are negative.    Physical Exam Updated Vital Signs BP (!) 145/94   Pulse (!) 59   Temp 98.1 F (36.7 C) (Oral)   Resp (!) 22   Ht 5\' 8"  (1.727 m)   Wt 81.6 kg (180 lb)   SpO2 100%   BMI 27.37 kg/m   Physical Exam  Constitutional: He is oriented to person, place, and time. He appears well-developed and well-nourished.  Non-toxic appearance. He does not appear ill. No distress.  HENT:  Head: Normocephalic and atraumatic.  Right Ear: External ear normal.  Left Ear: External ear normal.  Nose: Nose normal.  Eyes: Right eye exhibits no discharge. Left eye exhibits no discharge.  Neck: Neck supple.  Cardiovascular: Normal rate, regular rhythm and normal heart sounds.  Pulses:      Radial pulses are 2+ on the right side, and 2+ on the left side.  Pulmonary/Chest: Effort normal and breath sounds normal.  Abdominal: Soft. There is no tenderness.  Musculoskeletal: He exhibits no edema.       Right lower leg: He exhibits no edema.       Left lower leg: He exhibits no edema.  Neurological: He is alert and oriented to person, place, and time.  Skin: Skin is warm and dry.  Nursing note and vitals reviewed.    ED Treatments / Results  Labs (all labs ordered are listed, but only abnormal results are displayed) Labs Reviewed  BASIC METABOLIC PANEL - Abnormal; Notable for the following components:      Result Value   Glucose, Bld 103 (*)    All other components within normal limits  CBC  I-STAT TROPONIN, ED  I-STAT TROPONIN, ED    EKG EKG Interpretation  Date/Time:  Tuesday December 19 2017 08:43:51 EDT Ventricular Rate:  71 PR Interval:  142 QRS Duration: 103 QT Interval:  408 QTC Calculation: 444 R Axis:   -15 Text Interpretation:  Sinus rhythm Borderline left axis deviation Low voltage, precordial leads Abnormal R-wave progression, early transition Confirmed by Pricilla Loveless [phone]) on 12/19/2017 8:50:34 AM   EKG  Interpretation  Date/Time:  Tuesday December 19 2017 11:55:44 EDT Ventricular Rate:  63 PR Interval:  142 QRS Duration: 102 QT Interval:  420 QTC Calculation: 430 R Axis:   10 Text Interpretation:  Sinus rhythm Abnormal R-wave progression, early transition no significant change since earlier in the day Confirmed by Pricilla Loveless [phone]) on 12/19/2017 12:05:01 PM        Radiology Dg Chest 2 View  Result Date: 12/19/2017 CLINICAL DATA:  Left chest pain.  Left arm tingling. EXAM: CHEST - 2 VIEW COMPARISON:  11/26/2007 FINDINGS: The heart size and mediastinal contours are within normal limits. Both lungs are clear. The visualized skeletal structures are unremarkable. IMPRESSION: No  active cardiopulmonary disease. Electronically Signed   By: Elige KoHetal  Patel   On: 12/19/2017 08:52    Procedures Procedures (including critical care time)  Medications Ordered in ED Medications  aspirin chewable tablet 324 mg (324 mg Oral Given 12/19/17 0902)     Initial Impression / Assessment and Plan / ED Course  I have reviewed the triage vital signs and the nursing notes.  Pertinent labs & imaging results that were available during my care of the patient were reviewed by me and considered in my medical decision making (see chart for details).     Patient's chest pain is atypical.  There is no tingling, numbness, or weakness to his left arm at this time.  Could be cardiac but he is at otherwise low risk and has a low heart score.  With no ischemic ECG changes and 2 negative troponins,  I do not think he needs emergent ACS work-up and think that he is stable for outpatient follow-up with PCP.  However we did discuss strict return precautions.  I doubt PE or dissection.  Final Clinical Impressions(s) / ED Diagnoses   Final diagnoses:  Nonspecific chest pain    ED Discharge Orders    None       Pricilla LovelessGoldston, Brack Shaddock, MD 12/19/17 1742

## 2017-12-19 NOTE — ED Triage Notes (Signed)
Pt. Stated, I felt some tingling this morning around 400 with some chest pain. Left arm Im still having some tingling in right arm

## 2017-12-19 NOTE — ED Notes (Signed)
Pt in xray

## 2017-12-19 NOTE — Discharge Instructions (Addendum)
If your chest pain returns or becomes more consistent, more severe, or you notice sweating, vomiting, shortness of breath or any other new/concerning symptoms and return to the ER for evaluation.  Otherwise follow-up with your primary care physician in the next couple days.

## 2019-04-20 IMAGING — CR DG CHEST 2V
2 series · 2 of 2 positions shown · non-contrast
Comparison: 11/26/2007

CLINICAL DATA: Left chest pain.  Left arm tingling.

EXAM:
CHEST - 2 VIEW

[chest pa]
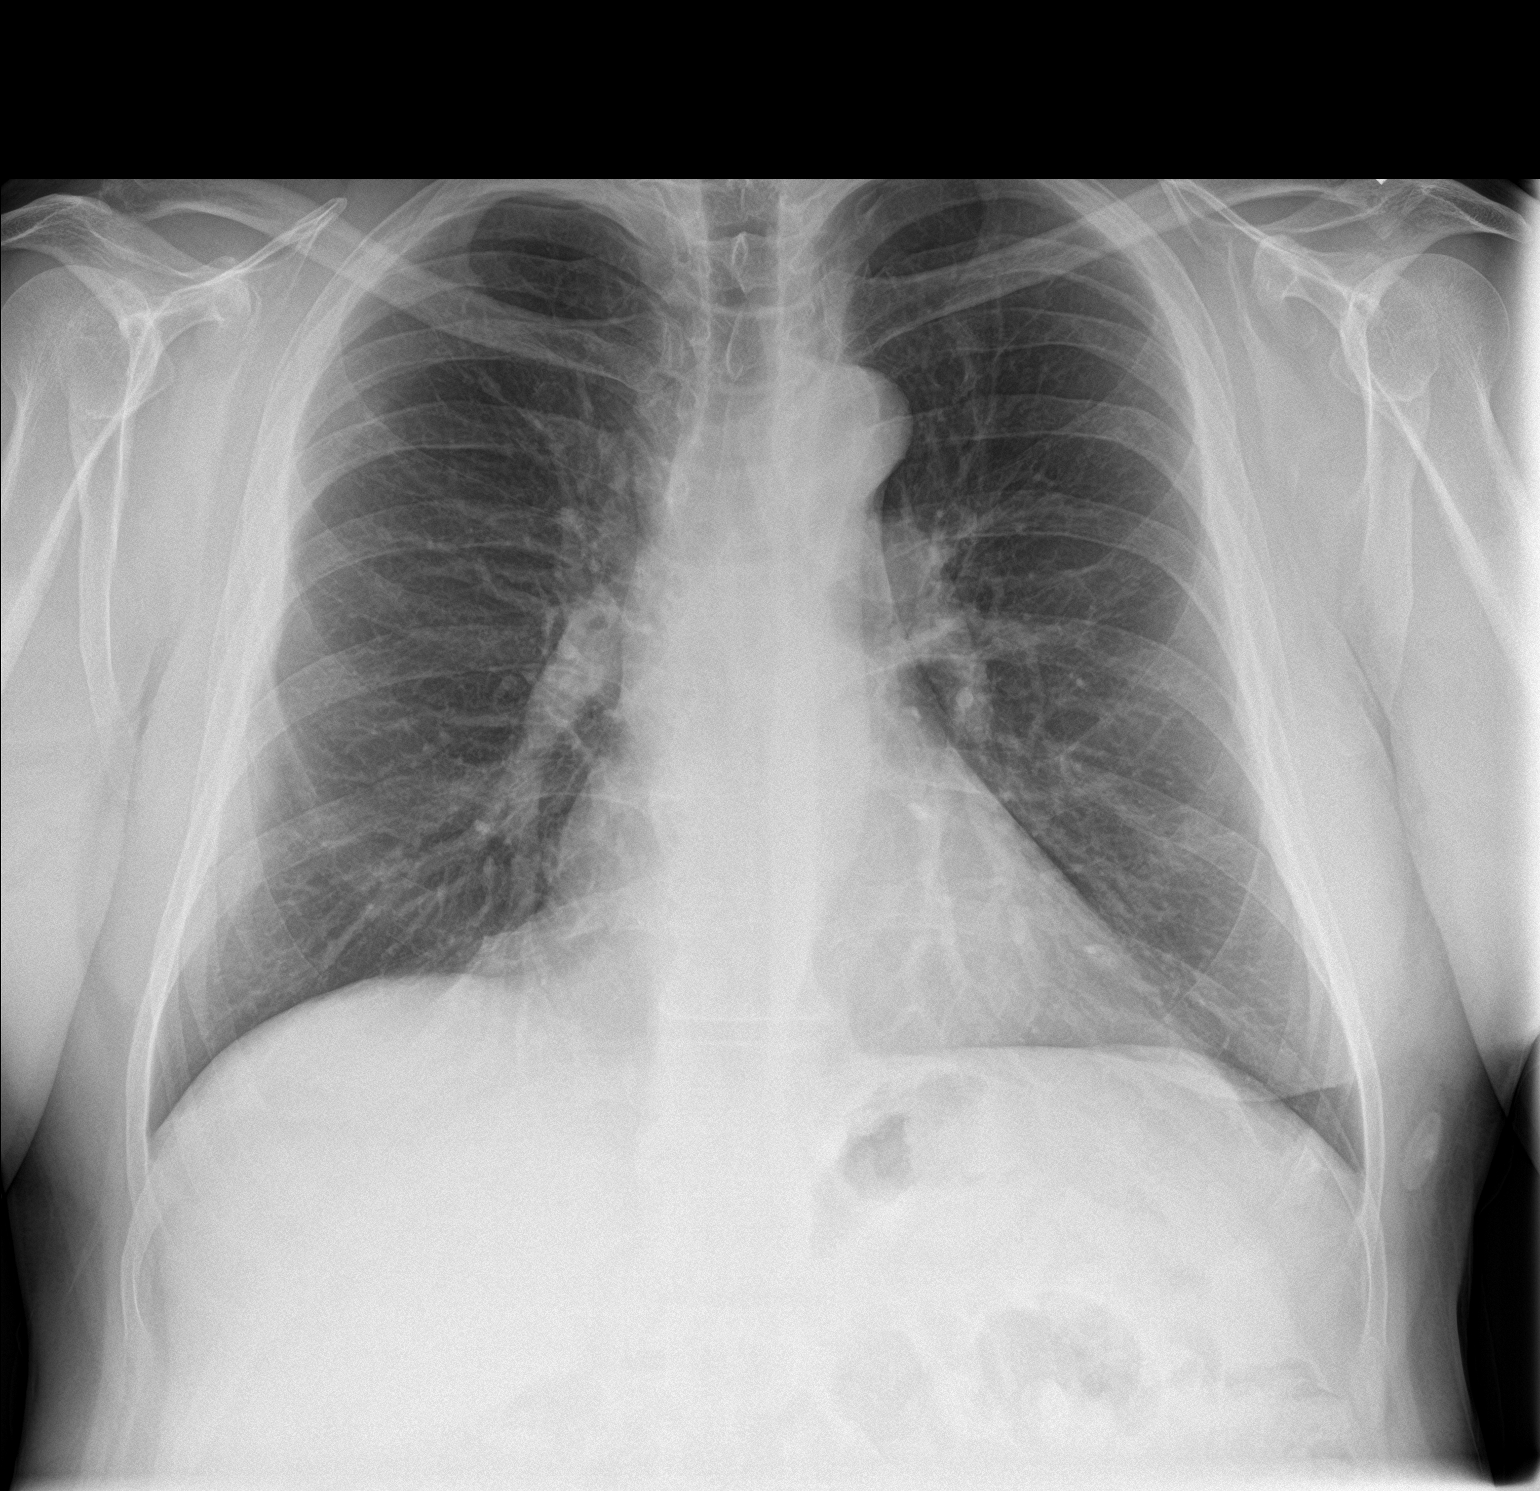

[chest lat]
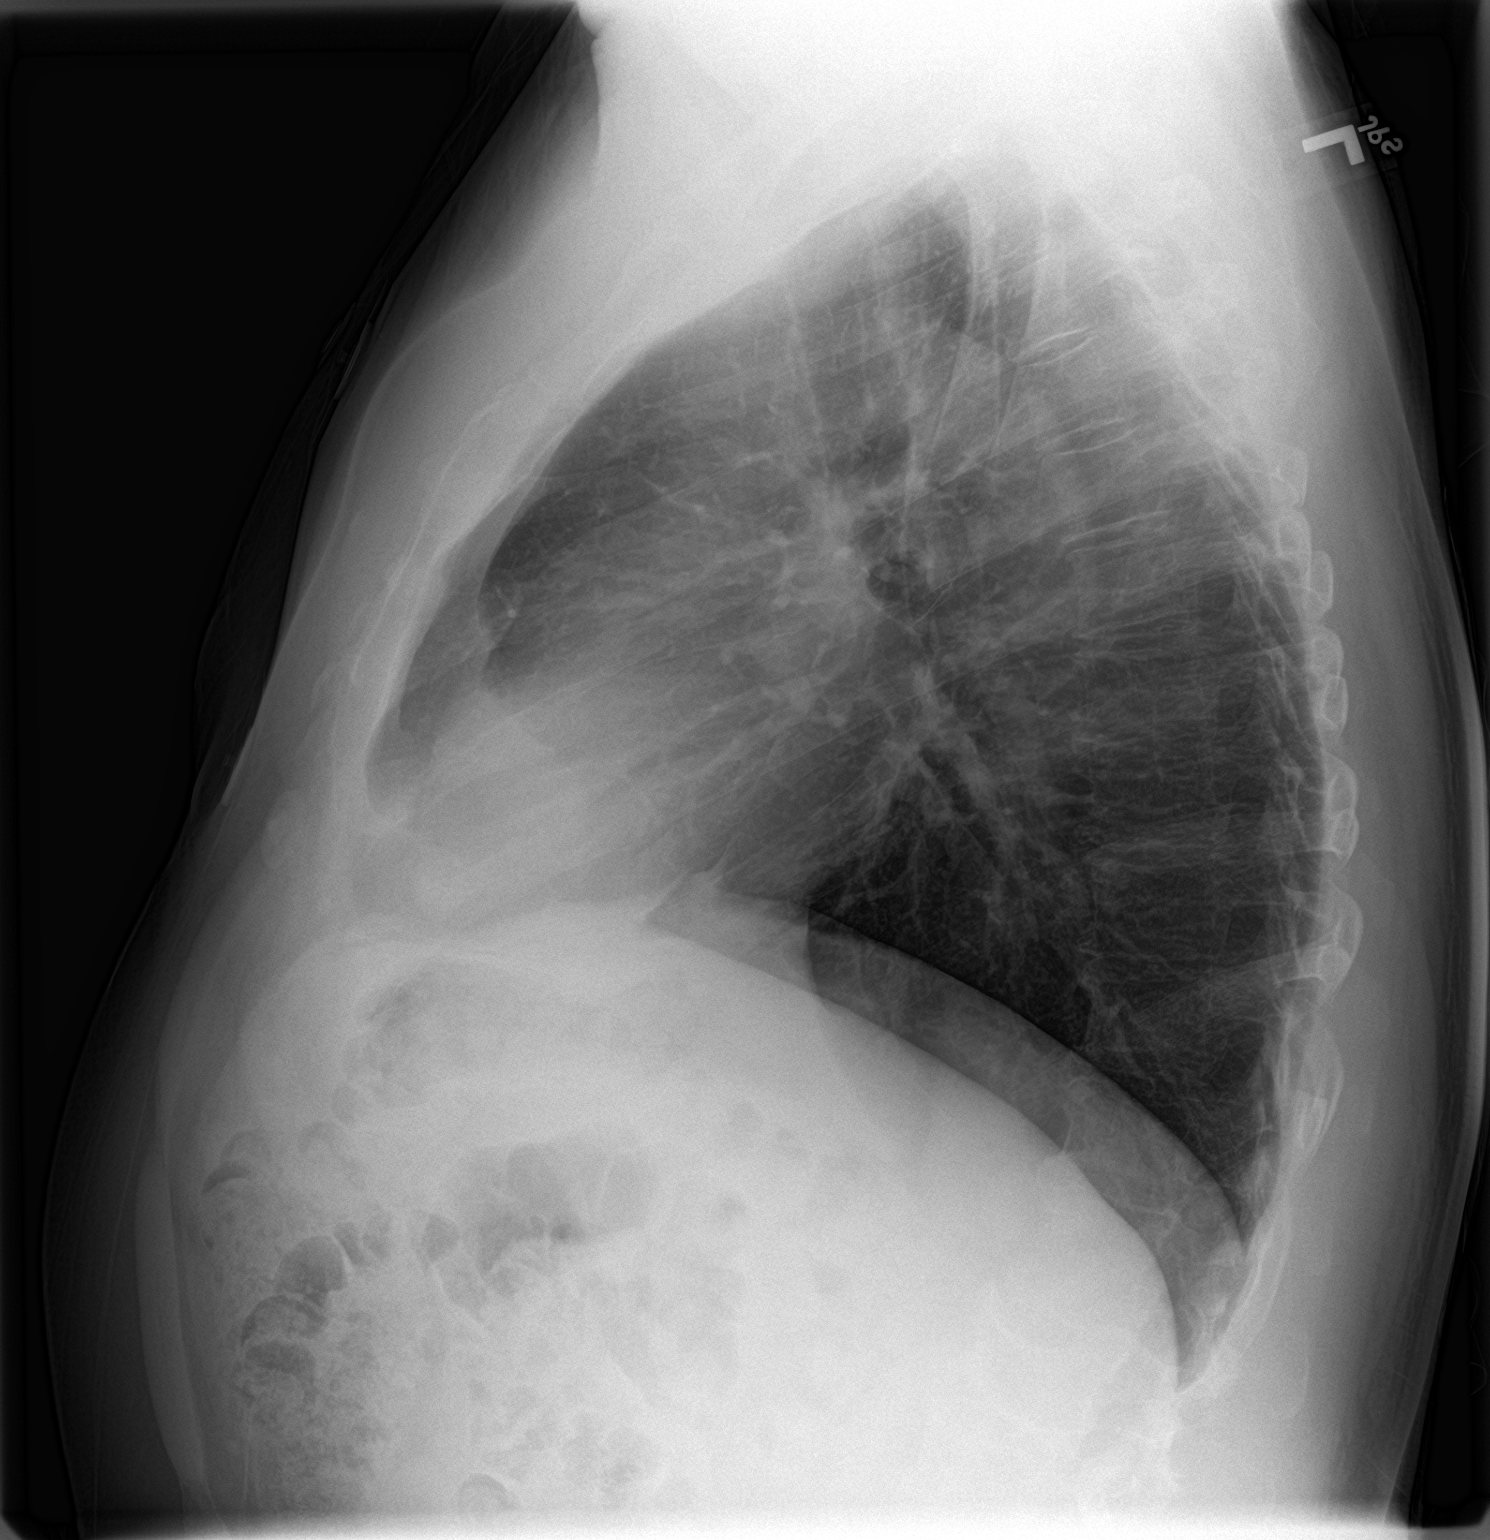

[2 of 2 positions shown; findings below may reference images not displayed]

FINDINGS: The heart size and mediastinal contours are within normal limits.
Both lungs are clear. The visualized skeletal structures are
unremarkable.
IMPRESSION: No active cardiopulmonary disease.
